# Patient Record
Sex: Male | Born: 1990 | Race: White | Hispanic: No | Marital: Single | Smoking: Never smoker
Health system: Southern US, Community
[De-identification: ages and names within clinical notes are randomized; demographics above are authoritative.]

## PROBLEM LIST (undated history)

## (undated) HISTORY — PX: OTHER SURGICAL HISTORY: SHX169

---
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
## 2022-05-04 ENCOUNTER — Emergency Department (HOSPITAL_COMMUNITY): Payer: BC Managed Care – PPO

## 2022-05-04 ENCOUNTER — Other Ambulatory Visit: Payer: Self-pay

## 2022-05-04 ENCOUNTER — Encounter (HOSPITAL_COMMUNITY): Payer: Self-pay

## 2022-05-04 ENCOUNTER — Emergency Department (HOSPITAL_COMMUNITY)
Admission: EM | Admit: 2022-05-04 | Discharge: 2022-05-05 | Disposition: A | Discharge status: Home / Self Care | Payer: BC Managed Care – PPO | Attending: Emergency Medicine | Admitting: Emergency Medicine

## 2022-05-04 DIAGNOSIS — I10 Essential (primary) hypertension: Secondary | ICD-10-CM | POA: Diagnosis not present

## 2022-05-04 DIAGNOSIS — M4317 Spondylolisthesis, lumbosacral region: Secondary | ICD-10-CM | POA: Diagnosis not present

## 2022-05-04 DIAGNOSIS — Z23 Encounter for immunization: Secondary | ICD-10-CM | POA: Insufficient documentation

## 2022-05-04 DIAGNOSIS — R519 Headache, unspecified: Secondary | ICD-10-CM | POA: Diagnosis not present

## 2022-05-04 DIAGNOSIS — R000 Tachycardia, unspecified: Secondary | ICD-10-CM | POA: Insufficient documentation

## 2022-05-04 DIAGNOSIS — R609 Edema, unspecified: Secondary | ICD-10-CM | POA: Diagnosis not present

## 2022-05-04 DIAGNOSIS — T07XXXA Unspecified multiple injuries, initial encounter: Secondary | ICD-10-CM

## 2022-05-04 DIAGNOSIS — S299XXA Unspecified injury of thorax, initial encounter: Secondary | ICD-10-CM | POA: Diagnosis not present

## 2022-05-04 DIAGNOSIS — S3991XA Unspecified injury of abdomen, initial encounter: Secondary | ICD-10-CM | POA: Diagnosis not present

## 2022-05-04 DIAGNOSIS — N281 Cyst of kidney, acquired: Secondary | ICD-10-CM | POA: Diagnosis not present

## 2022-05-04 DIAGNOSIS — K760 Fatty (change of) liver, not elsewhere classified: Secondary | ICD-10-CM | POA: Diagnosis not present

## 2022-05-04 DIAGNOSIS — Z041 Encounter for examination and observation following transport accident: Secondary | ICD-10-CM | POA: Diagnosis not present

## 2022-05-04 DIAGNOSIS — T148XXA Other injury of unspecified body region, initial encounter: Secondary | ICD-10-CM | POA: Insufficient documentation

## 2022-05-04 DIAGNOSIS — M542 Cervicalgia: Secondary | ICD-10-CM | POA: Diagnosis not present

## 2022-05-04 DIAGNOSIS — M25511 Pain in right shoulder: Secondary | ICD-10-CM | POA: Diagnosis not present

## 2022-05-04 DIAGNOSIS — Z20822 Contact with and (suspected) exposure to covid-19: Secondary | ICD-10-CM | POA: Diagnosis not present

## 2022-05-04 DIAGNOSIS — S61411A Laceration without foreign body of right hand, initial encounter: Secondary | ICD-10-CM | POA: Insufficient documentation

## 2022-05-04 DIAGNOSIS — M25532 Pain in left wrist: Secondary | ICD-10-CM | POA: Diagnosis not present

## 2022-05-04 DIAGNOSIS — Y92410 Unspecified street and highway as the place of occurrence of the external cause: Secondary | ICD-10-CM | POA: Diagnosis not present

## 2022-05-04 DIAGNOSIS — S63502A Unspecified sprain of left wrist, initial encounter: Secondary | ICD-10-CM | POA: Insufficient documentation

## 2022-05-04 DIAGNOSIS — S6991XA Unspecified injury of right wrist, hand and finger(s), initial encounter: Secondary | ICD-10-CM | POA: Diagnosis not present

## 2022-05-04 DIAGNOSIS — G4489 Other headache syndrome: Secondary | ICD-10-CM | POA: Diagnosis not present

## 2022-05-04 DIAGNOSIS — M79641 Pain in right hand: Secondary | ICD-10-CM | POA: Diagnosis not present

## 2022-05-04 DIAGNOSIS — S63592A Other specified sprain of left wrist, initial encounter: Secondary | ICD-10-CM | POA: Diagnosis not present

## 2022-05-04 LAB — COMPREHENSIVE METABOLIC PANEL
ALT: 30 U/L (ref 0–44)
AST: 28 U/L (ref 15–41)
Albumin: 4.2 g/dL (ref 3.5–5.0)
Alkaline Phosphatase: 80 U/L (ref 38–126)
Anion gap: 9 (ref 5–15)
BUN: 10 mg/dL (ref 6–20)
CO2: 22 mmol/L (ref 22–32)
Calcium: 9.7 mg/dL (ref 8.9–10.3)
Chloride: 109 mmol/L (ref 98–111)
Creatinine, Ser: 1.04 mg/dL (ref 0.61–1.24)
GFR, Estimated: >60 mL/min (ref >60)
Glucose, Bld: 102 mg/dL — ABNORMAL HIGH (ref 70–99)
Potassium: 3.8 mmol/L (ref 3.5–5.1)
Sodium: 140 mmol/L (ref 135–145)
Total Bilirubin: 0.5 mg/dL (ref 0.3–1.2)
Total Protein: 6.9 g/dL (ref 6.5–8.1)

## 2022-05-04 LAB — I-STAT CHEM 8, ED
BUN: 11 mg/dL (ref 6–20)
Calcium, Ion: 1.13 mmol/L — ABNORMAL LOW (ref 1.15–1.40)
Chloride: 106 mmol/L (ref 98–111)
Creatinine, Ser: 1 mg/dL (ref 0.61–1.24)
Glucose, Bld: 100 mg/dL — ABNORMAL HIGH (ref 70–99)
HCT: 46 % (ref 39.0–52.0)
Hemoglobin: 15.6 g/dL (ref 13.0–17.0)
Potassium: 3.6 mmol/L (ref 3.5–5.1)
Sodium: 140 mmol/L (ref 135–145)
TCO2: 21 mmol/L — ABNORMAL LOW (ref 22–32)

## 2022-05-04 LAB — LACTIC ACID, PLASMA: Lactic Acid, Venous: 1.8 mmol/L (ref 0.5–1.9)

## 2022-05-04 LAB — CBC
HCT: 45.9 % (ref 39.0–52.0)
Hemoglobin: 15.9 g/dL (ref 13.0–17.0)
MCH: 30.2 pg (ref 26.0–34.0)
MCHC: 34.6 g/dL (ref 30.0–36.0)
MCV: 87.1 fL (ref 80.0–100.0)
Platelets: 270 10*3/uL (ref 150–400)
RBC: 5.27 MIL/uL (ref 4.22–5.81)
RDW: 12.4 % (ref 11.5–15.5)
WBC: 11.3 10*3/uL — ABNORMAL HIGH (ref 4.0–10.5)
nRBC: 0 % (ref 0.0–0.2)

## 2022-05-04 LAB — PROTIME-INR
INR: 1 (ref 0.8–1.2)
Prothrombin Time: 13 seconds (ref 11.4–15.2)

## 2022-05-04 LAB — ETHANOL: Alcohol, Ethyl (B): <10 mg/dL (ref <10)

## 2022-05-04 MED ORDER — TETANUS-DIPHTH-ACELL PERTUSSIS 5-2.5-18.5 LF-MCG/0.5 IM SUSY
0.5000 mL | PREFILLED_SYRINGE | Freq: Once | INTRAMUSCULAR | Status: AC
  Administered 2022-05-04: 0.5 mL via INTRAMUSCULAR
  Filled 2022-05-04: qty 0.5

## 2022-05-04 MED ORDER — LACTATED RINGERS IV BOLUS
1000.0000 mL | Freq: Once | INTRAVENOUS | Status: AC
  Administered 2022-05-04: 1000 mL via INTRAVENOUS

## 2022-05-04 MED ORDER — IBUPROFEN 400 MG PO TABS
600.0000 mg | ORAL_TABLET | Freq: Once | ORAL | Status: AC
  Administered 2022-05-04: 600 mg via ORAL
  Filled 2022-05-04: qty 1

## 2022-05-04 NOTE — ED Notes (Signed)
Pt to CT
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
## 2022-05-04 NOTE — Progress Notes (Signed)
Orthopedic Tech Progress Note Patient Details:  Richard Pearson 01-Apr-1991 [id number]  Ortho Devices Type of Ortho Device: Velcro wrist splint Ortho Device/Splint Location: left wrist Ortho Device/Splint Interventions: Ordered, Application, Adjustment   Post Interventions Patient Tolerated: Well Instructions Provided: Adjustment of device, Care of device  Demone Lyles OTR/L 05/04/2022, 10:51 PM
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
## 2022-05-04 NOTE — ED Notes (Signed)
Pt returned from CT, portable x-ray at bedside for extremity x-rays.
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
## 2022-05-05 ENCOUNTER — Emergency Department (HOSPITAL_COMMUNITY): Payer: BC Managed Care – PPO

## 2022-05-05 DIAGNOSIS — S61411A Laceration without foreign body of right hand, initial encounter: Secondary | ICD-10-CM | POA: Diagnosis not present

## 2022-05-05 DIAGNOSIS — K760 Fatty (change of) liver, not elsewhere classified: Secondary | ICD-10-CM | POA: Diagnosis not present

## 2022-05-05 DIAGNOSIS — S299XXA Unspecified injury of thorax, initial encounter: Secondary | ICD-10-CM | POA: Diagnosis not present

## 2022-05-05 DIAGNOSIS — S3991XA Unspecified injury of abdomen, initial encounter: Secondary | ICD-10-CM | POA: Diagnosis not present

## 2022-05-05 DIAGNOSIS — M4317 Spondylolisthesis, lumbosacral region: Secondary | ICD-10-CM | POA: Diagnosis not present

## 2022-05-05 DIAGNOSIS — N281 Cyst of kidney, acquired: Secondary | ICD-10-CM | POA: Diagnosis not present

## 2022-05-05 LAB — URINALYSIS, ROUTINE W REFLEX MICROSCOPIC
Bacteria, UA: NONE SEEN
Bilirubin Urine: NEGATIVE
Glucose, UA: NEGATIVE mg/dL
Hgb urine dipstick: NEGATIVE
Ketones, ur: NEGATIVE mg/dL
Leukocytes,Ua: NEGATIVE
Nitrite: NEGATIVE
Protein, ur: NEGATIVE mg/dL
Specific Gravity, Urine: 1.021 (ref 1.005–1.030)
pH: 6 (ref 5.0–8.0)

## 2022-05-05 LAB — RESP PANEL BY RT-PCR (RSV, FLU A&B, COVID)  RVPGX2
Influenza A by PCR: NEGATIVE
Influenza B by PCR: NEGATIVE
Resp Syncytial Virus by PCR: NEGATIVE
SARS Coronavirus 2 by RT PCR: NEGATIVE

## 2022-05-05 MED ORDER — IOHEXOL 350 MG/ML SOLN
75.0000 mL | Freq: Once | INTRAVENOUS | Status: AC | PRN
  Administered 2022-05-05: 75 mL via INTRAVENOUS
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
## 2022-05-13 DIAGNOSIS — M25532 Pain in left wrist: Secondary | ICD-10-CM | POA: Diagnosis not present

## 2022-06-03 DIAGNOSIS — M25532 Pain in left wrist: Secondary | ICD-10-CM | POA: Diagnosis not present

## 2022-06-03 DIAGNOSIS — S52515A Nondisplaced fracture of left radial styloid process, initial encounter for closed fracture: Secondary | ICD-10-CM | POA: Diagnosis not present

## 2022-06-10 DIAGNOSIS — J069 Acute upper respiratory infection, unspecified: Secondary | ICD-10-CM | POA: Diagnosis not present

## 2022-06-10 DIAGNOSIS — B09 Unspecified viral infection characterized by skin and mucous membrane lesions: Secondary | ICD-10-CM | POA: Diagnosis not present

## 2022-06-10 DIAGNOSIS — R21 Rash and other nonspecific skin eruption: Secondary | ICD-10-CM | POA: Diagnosis not present

## 2022-12-13 DIAGNOSIS — J329 Chronic sinusitis, unspecified: Secondary | ICD-10-CM | POA: Diagnosis not present

## 2022-12-13 DIAGNOSIS — H66001 Acute suppurative otitis media without spontaneous rupture of ear drum, right ear: Secondary | ICD-10-CM | POA: Diagnosis not present

## 2022-12-13 DIAGNOSIS — J40 Bronchitis, not specified as acute or chronic: Secondary | ICD-10-CM | POA: Diagnosis not present
